# Patient Record
Sex: Male | Born: 2000 | Race: White | Hispanic: No | Marital: Single | Smoking: Never smoker
Health system: Southern US, Community
[De-identification: ages and names within clinical notes are randomized; demographics above are authoritative.]

## PROBLEM LIST (undated history)

## (undated) HISTORY — PX: WISDOM TOOTH EXTRACTION: SHX21

---
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
## 2023-01-29 ENCOUNTER — Emergency Department (HOSPITAL_COMMUNITY): Payer: Self-pay

## 2023-01-29 ENCOUNTER — Other Ambulatory Visit: Payer: Self-pay

## 2023-01-29 ENCOUNTER — Encounter (HOSPITAL_COMMUNITY): Payer: Self-pay

## 2023-01-29 ENCOUNTER — Emergency Department (HOSPITAL_COMMUNITY)
Admission: EM | Admit: 2023-01-29 | Discharge: 2023-01-29 | Disposition: A | Discharge status: Home / Self Care | Payer: Self-pay | Attending: Emergency Medicine | Admitting: Emergency Medicine

## 2023-01-29 DIAGNOSIS — R197 Diarrhea, unspecified: Secondary | ICD-10-CM | POA: Insufficient documentation

## 2023-01-29 DIAGNOSIS — R110 Nausea: Secondary | ICD-10-CM | POA: Insufficient documentation

## 2023-01-29 DIAGNOSIS — R109 Unspecified abdominal pain: Secondary | ICD-10-CM

## 2023-01-29 DIAGNOSIS — R079 Chest pain, unspecified: Secondary | ICD-10-CM | POA: Insufficient documentation

## 2023-01-29 DIAGNOSIS — R1013 Epigastric pain: Secondary | ICD-10-CM | POA: Insufficient documentation

## 2023-01-29 LAB — URINALYSIS, ROUTINE W REFLEX MICROSCOPIC
Bilirubin Urine: NEGATIVE
Glucose, UA: NEGATIVE mg/dL
Hgb urine dipstick: NEGATIVE
Ketones, ur: NEGATIVE mg/dL
Leukocytes,Ua: NEGATIVE
Nitrite: NEGATIVE
Protein, ur: NEGATIVE mg/dL
Specific Gravity, Urine: 1.02 (ref 1.005–1.030)
pH: 7 (ref 5.0–8.0)

## 2023-01-29 LAB — COMPREHENSIVE METABOLIC PANEL
ALT: 19 U/L (ref 0–44)
AST: 18 U/L (ref 15–41)
Albumin: 4.9 g/dL (ref 3.5–5.0)
Alkaline Phosphatase: 57 U/L (ref 38–126)
Anion gap: 8 (ref 5–15)
BUN: 11 mg/dL (ref 6–20)
CO2: 28 mmol/L (ref 22–32)
Calcium: 9.3 mg/dL (ref 8.9–10.3)
Chloride: 101 mmol/L (ref 98–111)
Creatinine, Ser: 1.32 mg/dL — ABNORMAL HIGH (ref 0.61–1.24)
GFR, Estimated: >60 mL/min (ref >60)
Glucose, Bld: 104 mg/dL — ABNORMAL HIGH (ref 70–99)
Potassium: 4 mmol/L (ref 3.5–5.1)
Sodium: 137 mmol/L (ref 135–145)
Total Bilirubin: 0.9 mg/dL (ref 0.3–1.2)
Total Protein: 7.9 g/dL (ref 6.5–8.1)

## 2023-01-29 LAB — CBC WITH DIFFERENTIAL/PLATELET
Abs Immature Granulocytes: 0.04 10*3/uL (ref 0.00–0.07)
Basophils Absolute: 0 10*3/uL (ref 0.0–0.1)
Basophils Relative: 0 %
Eosinophils Absolute: 0.2 10*3/uL (ref 0.0–0.5)
Eosinophils Relative: 2 %
HCT: 46.8 % (ref 39.0–52.0)
Hemoglobin: 15.5 g/dL (ref 13.0–17.0)
Immature Granulocytes: 1 %
Lymphocytes Relative: 45 %
Lymphs Abs: 3.6 10*3/uL (ref 0.7–4.0)
MCH: 29.7 pg (ref 26.0–34.0)
MCHC: 33.1 g/dL (ref 30.0–36.0)
MCV: 89.7 fL (ref 80.0–100.0)
Monocytes Absolute: 0.7 10*3/uL (ref 0.1–1.0)
Monocytes Relative: 9 %
Neutro Abs: 3.4 10*3/uL (ref 1.7–7.7)
Neutrophils Relative %: 43 %
Platelets: 278 10*3/uL (ref 150–400)
RBC: 5.22 MIL/uL (ref 4.22–5.81)
RDW: 12.9 % (ref 11.5–15.5)
WBC: 8 10*3/uL (ref 4.0–10.5)
nRBC: 0 % (ref 0.0–0.2)

## 2023-01-29 LAB — LIPASE, BLOOD: Lipase: 29 U/L (ref 11–51)

## 2023-01-29 LAB — TROPONIN I (HIGH SENSITIVITY): Troponin I (High Sensitivity): <2 ng/L (ref <18)

## 2023-01-29 MED ORDER — SODIUM CHLORIDE 0.9 % IV BOLUS
1000.0000 mL | Freq: Once | INTRAVENOUS | Status: AC
  Administered 2023-01-29: 1000 mL via INTRAVENOUS

## 2023-01-29 MED ORDER — ONDANSETRON HCL 4 MG/2ML IJ SOLN: 4.0000 mg | Freq: Once | INTRAMUSCULAR | Status: DC | PRN

## 2023-01-29 NOTE — ED Triage Notes (Signed)
Pt c/o chest pain x 2 days, nausea and abd pain with diarrhea x 2 weeks. Pt has taken tums with no relief.

## 2023-01-29 NOTE — Discharge Instructions (Addendum)
Evaluation for your chest pain and abdominal pain was overall reassuring.  Recommend you follow-up with your PCP.  If you have worsening chest pain, abdominal pain, fever, nausea vomiting diarrhea with or without blood or any other concerning symptom please return emerged part for further evaluation.

## 2023-01-29 NOTE — ED Provider Notes (Addendum)
[place] EMERGENCY DEPARTMENT AT [facility] Provider Note   CSN: [id number] Arrival date & time: 01/29/23  4098     History  Chief Complaint  Patient presents with   Chest Pain   Nausea   Diarrhea   HPI Thomas Greene is a 22 y.o. male presenting for abdominal pain and chest pain.  Chest pain started 2 days ago and is in the center of his chest and is nonradiating. Nonexertional, non reproducible but is worse with breathing.  No better or worse with meals. Feels achy.  Also endorses abdominal pain which started 2 weeks ago. Located in the epigastric region.  Also nonradiating.  Endorse associated nausea and diarrhea.  Denies emesis.  Output is nonbloody.  Denies urinary symptoms.  Denies fever or chills.  Denies calf tenderness, recent immobilization, known malignancy and coagulopathy.  Also endorses drinking about 1/5 of liquor every day along with a few beers.  HPI     Home Medications Prior to Admission medications   Medication Sig Start Date End Date Taking? Authorizing Provider  benzonatate (TESSALON) 100 MG capsule Take 1 capsule (100 mg total) by mouth every 8 (eight) hours. 05/13/20   Avegno, Zachery Dakins, FNP  bisacodyl (DULCOLAX) 5 MG EC tablet Take 15 mg by mouth once.    [provider]  cetirizine (ZYRTEC ALLERGY) 10 MG tablet Take 1 tablet (10 mg total) by mouth daily. 05/13/20   Avegno, Zachery Dakins, FNP  fluticasone (FLONASE) 50 MCG/ACT nasal spray Place 1 spray into both nostrils daily for 14 days. 05/13/20 05/27/20  Avegno, Zachery Dakins, FNP  HYDROcodone-homatropine (HYCODAN) 5-1.5 MG/5ML syrup Take 5 mLs by mouth every 8 (eight) hours as needed for cough. 05/16/20   Wurst, [place], PA-C      Allergies    Patient has no known allergies.    Review of Systems   See HPI for pertinent positives  Physical Exam Updated Vital Signs BP 138/70   Pulse 87   Temp 98.6 F (37 C) (Oral)   Resp 18   Ht 5\' 8"  (1.727 m)   Wt 81.6 kg   SpO2 98%    BMI 27.37 kg/m  Physical Exam Vitals and nursing note reviewed.  HENT:     Head: Normocephalic and atraumatic.     Mouth/Throat:     Mouth: Mucous membranes are moist.  Eyes:     General:        Right eye: No discharge.        Left eye: No discharge.     Conjunctiva/sclera: Conjunctivae normal.  Cardiovascular:     Rate and Rhythm: Normal rate and regular rhythm.     Pulses: Normal pulses.          Radial pulses are 2+ on the right side and 2+ on the left side.     Heart sounds: Normal heart sounds.  Pulmonary:     Effort: Pulmonary effort is normal.     Breath sounds: Normal breath sounds.  Abdominal:     General: Abdomen is flat.     Palpations: Abdomen is soft.     Tenderness: There is no abdominal tenderness.  Skin:    General: Skin is warm and dry.  Neurological:     General: No focal deficit present.  Psychiatric:        Mood and Affect: Mood normal.     ED Results / Procedures / Treatments   Labs (all labs ordered are listed, but only abnormal results  are displayed) Labs Reviewed  COMPREHENSIVE METABOLIC PANEL - Abnormal; Notable for the following components:      Result Value   Glucose, Bld 104 (*)    Creatinine, Ser 1.32 (*)    All other components within normal limits  CBC WITH DIFFERENTIAL/PLATELET  LIPASE, BLOOD  URINALYSIS, ROUTINE W REFLEX MICROSCOPIC  TROPONIN I (HIGH SENSITIVITY)    EKG EKG Interpretation  Date/Time:  Tuesday January 29 2023 09:57:11 EDT Ventricular Rate:  83 PR Interval:  142 QRS Duration: 99 QT Interval:  359 QTC Calculation: 422 R Axis:   76 Text Interpretation: Sinus rhythm RSR' in V1 or V2, probably normal variant Probable left ventricular hypertrophy ST elevation suggests acute pericarditis Confirmed by Eber Hong [phone]) on 01/29/2023 10:07:28 AM  Radiology DG Chest 2 View  Result Date: 01/29/2023 CLINICAL DATA:  Chest pain EXAM: CHEST - 2 VIEW COMPARISON:  None Available. FINDINGS: No consolidation, pneumothorax  or effusion. No edema. Normal cardiopericardial silhouette. Overlapping cardiac leads. IMPRESSION: No acute cardiopulmonary disease. Electronically Signed   By: Karen Kays M.D.   On: 01/29/2023 10:54    Procedures Procedures    Medications Ordered in ED Medications  ondansetron (ZOFRAN) injection 4 mg (has no administration in time range)  sodium chloride 0.9 % bolus 1,000 mL (1,000 mLs Intravenous New Bag/Given 01/29/23 1240)    ED Course/ Medical Decision Making/ A&P                             Medical Decision Making Amount and/or Complexity of Data Reviewed Labs: ordered. Radiology: ordered.  Risk Prescription drug management.   22 year old male who is well-appearing presenting for abdominal pain and chest pain.  Exam is unremarkable.  DDx includes intra-abdominal infection, ACS, PE, pneumonia.  Overall looks clinically well.  Workup is reassuring.  Labs indicate possibly a mild AKI.  Treated with normal saline bolus.  Advised to rehydrate at home.  Discussed his alcohol drinking and strongly advised him to quit.  Discussed pertinent return precautions.  Advised to follow-up with his PCP.  Vital stable throughout encounter.      Final Clinical Impression(s) / ED Diagnoses Final diagnoses:  Chest pain, unspecified type  Abdominal pain, unspecified abdominal location    Rx / DC Orders ED Discharge Orders     None         Gareth Eagle, PA-C 01/29/23 1345    Gareth Eagle, PA-C 01/29/23 1346    Eber Hong, MD 02/08/23 [phone]

## 2024-07-22 ENCOUNTER — Ambulatory Visit: Payer: Self-pay

## 2024-08-24 ENCOUNTER — Ambulatory Visit (INDEPENDENT_AMBULATORY_CARE_PROVIDER_SITE_OTHER): Payer: Self-pay | Admitting: Orthopedic Surgery

## 2024-08-24 ENCOUNTER — Other Ambulatory Visit: Payer: Self-pay

## 2024-08-24 VITALS — BP 136/84 | HR 99 | Ht 69.0 in | Wt 200.4 lb

## 2024-08-24 DIAGNOSIS — M545 Low back pain, unspecified: Secondary | ICD-10-CM

## 2024-08-24 NOTE — Progress Notes (Addendum)
 Orthopedic Spine Surgery Office Note  Assessment: Patient is a 24 y.o. male with low back pain that radiates into the left posterior thigh.  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  I would want to see his imaging though so I told him to drop off his CD.  I can look at the CD and then give him a call to discuss the plan in more detail   Patient expressed understanding of the plan and all questions were answered to the patient's satisfaction.   ___________________________________________________________________________   History:  Patient is a 24 y.o. male who presents today for lumbar spine.  Patient has had about 11 months of low back pain that radiates in the left lower extremity.  He feels going to the left posterior thigh.  He rates the level of the knee.  There was no trauma or injury that preceded the onset of the pain.  He has tried several medications over the last several months without any relief of his pain.  He has no pain going into the right lower extremity.  He notices numbness and paresthesias along the posterior aspect of the leg.  He said no bowel or bladder incontinence.  No saddle anesthesia.  No weakness noted in the lower extremities.  Treatments tried: meloxicam, gabapentin, Tylenol, prednisone  Review of systems: Denies fevers and chills, night sweats, unexplained weight loss, history of cancer, pain that wakes him at night  Past medical history: None  Allergies: NKDA  Past surgical history:  Wisdom tooth removal  Social history: Denies use of nicotine product (smoking, vaping, patches, smokeless) Alcohol use: Denies Denies recreational drug use   Physical Exam:  BMI of 29.6  General: no  acute distress, appears stated age Neurologic: alert, answering questions appropriately, following commands Respiratory: unlabored breathing on room air, symmetric chest rise Psychiatric: appropriate affect, normal cadence to speech   MSK (spine):  -Strength exam      Left  Right EHL    5/5  5/5 TA    5/5  5/5 GSC    5/5  5/5 Knee extension  5/5  5/5 Hip flexion   5/5  5/5  -Sensory exam    Sensation intact to light touch in L3-S1 nerve distributions of bilateral lower extremities  -Achilles DTR: 2/4 on the left, 2/4 on the right -Patellar tendon DTR: 2/4 on the left, 2/4 on the right  -Straight leg raise: Negative bilaterally -Clonus: no beats bilaterally  -Left hip exam: No pain through range of motion, negative FABER, negative Stinchfield -Right hip exam: No pain through range of motion, negative FABER, negative Stinchfield  Imaging: XRs of the lumbar spine from 08/24/2024 were independently reviewed and interpreted, showing disc height loss at L5/S1.  No other significant degenerative changes seen.  No evidence of instability on flexion/extension views.  No fracture or dislocation seen.  Patient showed a phone picture of the MRI that showed a disc herniation at L5/S1 with lateral recess stenosis on the left   Patient name: Thomas Greene Patient MRN: [id number] Date of visit: 08/24/24   Addendum: After seeing the patient earlier today, he did drop off a CD with a copy of his MRI.  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  No other significant stenosis seen.  I discussed the MRI finding with him on the phone.  As talked about in the office, he said he has tried multiple conservative treatments without any relief over the last 11 months, so surgery would be an option at this point.  I also discussed injection as an  option.  He wanted to proceed with surgical management.  The risks, benefits, alternatives of surgery were covered with them.  After our discussion, patient elected proceed.  My surgery scheduler will help to get him on the schedule at a mutually convenient time.  Patient will next be seen at the date of surgery.

## 2024-08-24 NOTE — Addendum Note (Signed)
 Addended by: GEORGINA SHARPER on: 08/24/2024 05:14 PM   Modules accepted: Level of Service

## 2024-09-02 ENCOUNTER — Ambulatory Visit (INDEPENDENT_AMBULATORY_CARE_PROVIDER_SITE_OTHER): Payer: Self-pay | Admitting: Family Medicine

## 2024-09-02 ENCOUNTER — Encounter (HOSPITAL_BASED_OUTPATIENT_CLINIC_OR_DEPARTMENT_OTHER): Payer: Self-pay | Admitting: Family Medicine

## 2024-09-02 VITALS — BP 130/79 | HR 93 | Temp 97.9°F | Resp 18 | Ht 69.0 in | Wt 199.0 lb

## 2024-09-02 DIAGNOSIS — Z0000 Encounter for general adult medical examination without abnormal findings: Secondary | ICD-10-CM

## 2024-09-02 DIAGNOSIS — H9319 Tinnitus, unspecified ear: Secondary | ICD-10-CM | POA: Insufficient documentation

## 2024-09-02 DIAGNOSIS — M545 Low back pain, unspecified: Secondary | ICD-10-CM | POA: Insufficient documentation

## 2024-09-02 DIAGNOSIS — H9313 Tinnitus, bilateral: Secondary | ICD-10-CM | POA: Diagnosis not present

## 2024-09-02 DIAGNOSIS — R41840 Attention and concentration deficit: Secondary | ICD-10-CM | POA: Diagnosis not present

## 2024-09-02 NOTE — Assessment & Plan Note (Signed)
 Recommend continued follow-up with spine specialist.  He indicates that he did provide previous complete advanced imaging to surgeons office.  If still waiting to hear back from them.  Advised that if he does not hear from them within the next week, to reach back out to the office to follow-up on imaging results and recommendations regarding treatment/possible surgery

## 2024-09-02 NOTE — Assessment & Plan Note (Deleted)
 Discussed considerations, can consider further evaluation with audiology, possible referral to ENT.  I do feel that given prior noise exposure, current occupation, suspect that he has had small amounts of damage over time which has led to current issue with tinnitus.  Still, he is fairly young to be having these issues.  Discussed importance of hearing protection, particularly while at work and in any loud environments.  Discussed that this damage can be cumulative and limited to progressive tinnitus as well as potential hearing loss in the future.  For now, we can monitor progress moving forward, consider referrals

## 2024-09-02 NOTE — Progress Notes (Signed)
 "  New Patient Office Visit  Subjective   Patient ID: Thomas Greene, male    DOB: May 19, 2001  Age: 24 y.o. MRN: [id number]  CC:  Chief Complaint  Patient presents with   Establish Care   Tinnitus    HPI Thomas Greene presents to establish care Last PCP - none  Discussed the use of AI scribe software for clinical note transcription with the patient, who gave verbal consent to proceed.  History of Present Illness Thomas Greene is a 24 year old male who presents with tinnitus and ADHD symptoms.  He experiences bilateral, constant tinnitus described as a 'dog whistle,' which is more noticeable in quiet environments. This has been ongoing for as long as he can remember without worsening. 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  He has a history of ADHD diagnosed in childhood but has not received treatment. 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 He has been managing symptoms on his own without medication.  He works in holiday representative and enjoys outdoor activities, although he limits these due to back problems. He frequently uses Tylenol for back pain and has been evaluated for a herniated disc. He is awaiting surgery and has previously had an MRI done.  Patient is originally from [place]. He works in holiday representative. He enjoys being outdoors, shooting pool.  Outpatient Encounter Medications as of 09/02/2024  Medication Sig   benzonatate  (TESSALON ) 100 MG capsule Take 1 capsule (100 mg total) by mouth every 8 (eight) hours. (Patient not taking: Reported on 09/02/2024)   bisacodyl (DULCOLAX) 5 MG EC tablet Take 15 mg by mouth once. (Patient not taking: Reported on 09/02/2024)   cetirizine  (ZYRTEC  ALLERGY) 10 MG tablet Take 1 tablet (10 mg total) by mouth daily. (Patient not taking: Reported on 09/02/2024)   fluticasone  (FLONASE ) 50  MCG/ACT nasal spray Place 1 spray into both nostrils daily for 14 days. (Patient not taking: Reported on 09/02/2024)   gabapentin (NEURONTIN) 300 MG capsule Take 300 mg by mouth 2 (two) times daily. (Patient not taking: Reported on 09/02/2024)   HYDROcodone -homatropine (HYCODAN) 5-1.5 MG/5ML syrup Take 5 mLs by mouth every 8 (eight) hours as needed for cough. (Patient not taking: Reported on 09/02/2024)   meloxicam (MOBIC) 15 MG tablet Take 15 mg by mouth daily as needed. (Patient not taking: Reported on 09/02/2024)   No facility-administered encounter medications on file as of 09/02/2024.    History reviewed. No pertinent past medical history.  Past Surgical History:  Procedure Laterality Date   WISDOM TOOTH EXTRACTION      History reviewed. No pertinent family history.  Social History   Socioeconomic History   Marital status: Single    Spouse name: Not on file   Number of children: Not on file   Years of education: Not on file   Highest education level: Not on file  Occupational History   Not on file  Tobacco Use   Smoking status: Never    Passive exposure: Never   Smokeless tobacco: Never  Substance and Sexual Activity   Alcohol use: Yes    Alcohol/week: 2.0 standard drinks of alcohol    Types: 1 Cans of beer, 1 Shots of liquor per week    Comment: 3 cases of beer /wk & 5th of liquir on weekends   Drug use: No   Sexual activity: Not on file  Other Topics Concern   Not on file  Social History Narrative   Not on file   Social Drivers of Health   Tobacco Use: Low Risk (09/02/2024)   Patient History    Smoking Tobacco Use: Never    Smokeless Tobacco Use: Never    Passive Exposure: Never  Financial Resource Strain: Not on file  Food Insecurity: Not on file  Transportation Needs: Not on file  Physical Activity: Not on file  Stress: Not on file  Social Connections: Not on file  Intimate Partner Violence: Not on file  Depression (EYV7-0): Not on file  Alcohol Screen:  Not on file  Housing: Not on file  Utilities: Not on file  Health Literacy: Not on file    Objective   BP 130/79 (BP Location: Left Arm, Patient Position: Sitting, Cuff Size: Normal)   Pulse 93   Temp 97.9 F (36.6 C) (Oral)   Resp 18   Ht 5' 9 (1.753 m)   Wt 199 lb (90.3 kg)   SpO2 99%   BMI 29.39 kg/m   Physical Exam  24 year old male in no acute distress Cardiovascular exam with regular rate and rhythm Lungs clear to auscultation bilaterally Bilateral external auditory canals are clear, normal tympanic membranes, no erythema, bulging, fluid noted.  Assessment & Plan:   Attention deficit Assessment & Plan: We discussed considerations.  He reports being diagnosed in the past but without any treatment previously.  He does not feel that symptoms are significantly affecting work or home life.  No issues at work with not completing certain tasks or performing things incorrectly or missing steps.  We discussed possible evaluation with [place] attention specialist, but patient does not feel strongly about pursuing this at present.  Can consider referral at any time in the future to have further evaluation with [place] attention specialist and ultimately if treatment is recommended, this is likely something that we would be able to manage moving forward   Lumbar pain Assessment & Plan: Recommend continued follow-up with spine specialist.  He indicates that he did provide previous complete advanced imaging to surgeons office.  If still waiting to hear back from them.  Advised that if he does not hear from them within the next week, to reach back out to the office to follow-up on imaging results and recommendations regarding treatment/possible surgery   Tinnitus of both ears Assessment & Plan: Discussed considerations, can consider further evaluation with audiology, possible referral to ENT.  I do feel that given prior noise exposure, current occupation, suspect that he has had  small amounts of damage over time which has led to current issue with tinnitus.  Still, he is fairly young to be having these issues.  Discussed importance of hearing protection, particularly while at work and in any loud environments.  Discussed that this damage can be cumulative and limited to progressive tinnitus as well as potential hearing loss in the future.  For now, we can monitor progress moving forward, consider referrals   Wellness examination -     CBC with Differential/Platelet; Future -     Comprehensive metabolic panel with GFR; Future -     Hemoglobin A1c; Future -     Lipid panel; Future -     TSH Rfx on Abnormal to Free T4; Future  Return in about 3 months (around 12/01/2024) for CPE with fasting labs 1 week prior.    ___________________________________________ Tynisha Ogan de [place], MD, ABFM, CAQSM Primary Care and Sports Medicine [facility]  [place] "

## 2024-09-02 NOTE — Assessment & Plan Note (Signed)
 We discussed considerations.  He reports being diagnosed in the past but without any treatment previously.  He does not feel that symptoms are significantly affecting work or home life.  No issues at work with not completing certain tasks or performing things incorrectly or missing steps.  We discussed possible evaluation with [place] attention specialist, but patient does not feel strongly about pursuing this at present.  Can consider referral at any time in the future to have further evaluation with [place] attention specialist and ultimately if treatment is recommended, this is likely something that we would be able to manage moving forward

## 2024-09-02 NOTE — Assessment & Plan Note (Signed)
 Discussed considerations, can consider further evaluation with audiology, possible referral to ENT.  I do feel that given prior noise exposure, current occupation, suspect that he has had small amounts of damage over time which has led to current issue with tinnitus.  Still, he is fairly young to be having these issues.  Discussed importance of hearing protection, particularly while at work and in any loud environments.  Discussed that this damage can be cumulative and limited to progressive tinnitus as well as potential hearing loss in the future.  For now, we can monitor progress moving forward, consider referrals

## 2024-09-11 ENCOUNTER — Encounter: Payer: Self-pay | Admitting: Orthopedic Surgery

## 2024-09-25 ENCOUNTER — Ambulatory Visit (HOSPITAL_COMMUNITY): Admission: RE | Admit: 2024-09-25 | Admitting: Orthopedic Surgery

## 2024-09-25 ENCOUNTER — Encounter: Admission: RE | Payer: Self-pay

## 2024-09-25 DIAGNOSIS — Z01818 Encounter for other preprocedural examination: Secondary | ICD-10-CM

## 2024-10-14 ENCOUNTER — Encounter: Admitting: Orthopedic Surgery

## 2024-12-07 ENCOUNTER — Ambulatory Visit (HOSPITAL_BASED_OUTPATIENT_CLINIC_OR_DEPARTMENT_OTHER)

## 2024-12-17 ENCOUNTER — Encounter (HOSPITAL_BASED_OUTPATIENT_CLINIC_OR_DEPARTMENT_OTHER): Admitting: Family Medicine
